# Patient Record
Sex: Female | Born: 1965 | Race: White | Hispanic: No | Marital: Married | Smoking: Never smoker
Health system: Southern US, Community
[De-identification: ages and names within clinical notes are randomized; demographics above are authoritative.]

## PROBLEM LIST (undated history)

## (undated) DIAGNOSIS — G43909 Migraine, unspecified, not intractable, without status migrainosus: Secondary | ICD-10-CM

## (undated) HISTORY — PX: WISDOM TOOTH EXTRACTION: SHX21

## (undated) HISTORY — PX: LEG SURGERY: SHX1003

---

## 1998-05-06 ENCOUNTER — Other Ambulatory Visit: Admission: RE | Admit: 1998-05-06 | Discharge: 1998-05-06 | Payer: Self-pay | Admitting: Obstetrics & Gynecology

## 1998-05-13 ENCOUNTER — Ambulatory Visit (HOSPITAL_COMMUNITY): Admission: RE | Admit: 1998-05-13 | Discharge: 1998-05-13 | Payer: Self-pay | Admitting: Obstetrics & Gynecology

## 1998-05-29 ENCOUNTER — Encounter: Payer: Self-pay | Admitting: Obstetrics & Gynecology

## 1998-05-29 ENCOUNTER — Ambulatory Visit (HOSPITAL_COMMUNITY): Admission: RE | Admit: 1998-05-29 | Discharge: 1998-05-29 | Payer: Self-pay | Admitting: Obstetrics & Gynecology

## 2001-04-13 ENCOUNTER — Other Ambulatory Visit: Admission: RE | Admit: 2001-04-13 | Discharge: 2001-04-13 | Payer: Self-pay | Admitting: Obstetrics & Gynecology

## 2002-12-27 ENCOUNTER — Other Ambulatory Visit: Admission: RE | Admit: 2002-12-27 | Discharge: 2002-12-27 | Payer: Self-pay | Admitting: Obstetrics & Gynecology

## 2004-07-07 ENCOUNTER — Other Ambulatory Visit: Admission: RE | Admit: 2004-07-07 | Discharge: 2004-07-07 | Payer: Self-pay | Admitting: Obstetrics & Gynecology

## 2007-08-07 ENCOUNTER — Inpatient Hospital Stay (HOSPITAL_COMMUNITY): Admission: AD | Admit: 2007-08-07 | Discharge: 2007-08-08 | Payer: Self-pay | Admitting: Obstetrics & Gynecology

## 2009-08-26 IMAGING — US MAMMO-RUNI-US
1 series · 9 of 9 positions shown · non-contrast
Comparison: NONE

CLINICAL DATA: JIMING.Jim RT(R)(M)(CT)   Diagnostic Mammogram.  
Right  breast asymmetry noted on screening mammogram dated 
09/20/2007.  

RIGHT BREAST MAMMOGRAM ADDITIONAL VIEWS AND RIGHT BREAST 
ULTRASOUND

[Series 1: us right breast · 0.07mm/px · 9 of 9 slices shown]
[im 1/9]
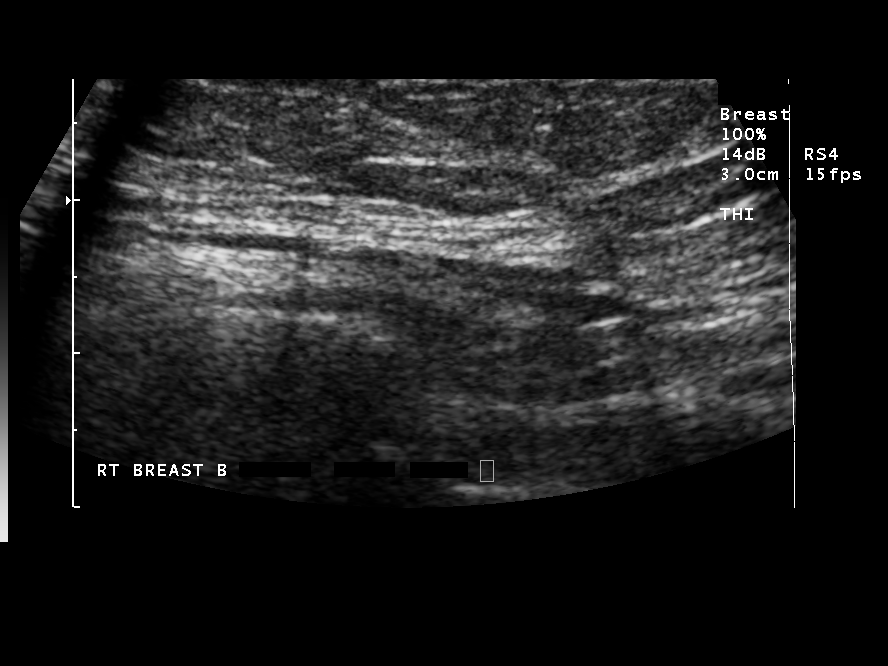
[im 2/9]
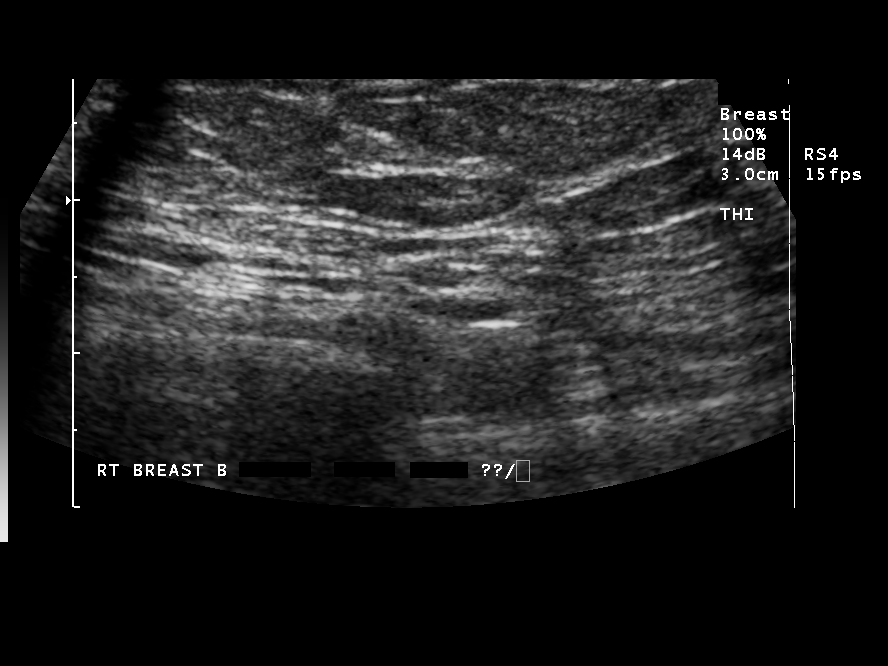
[im 3/9]
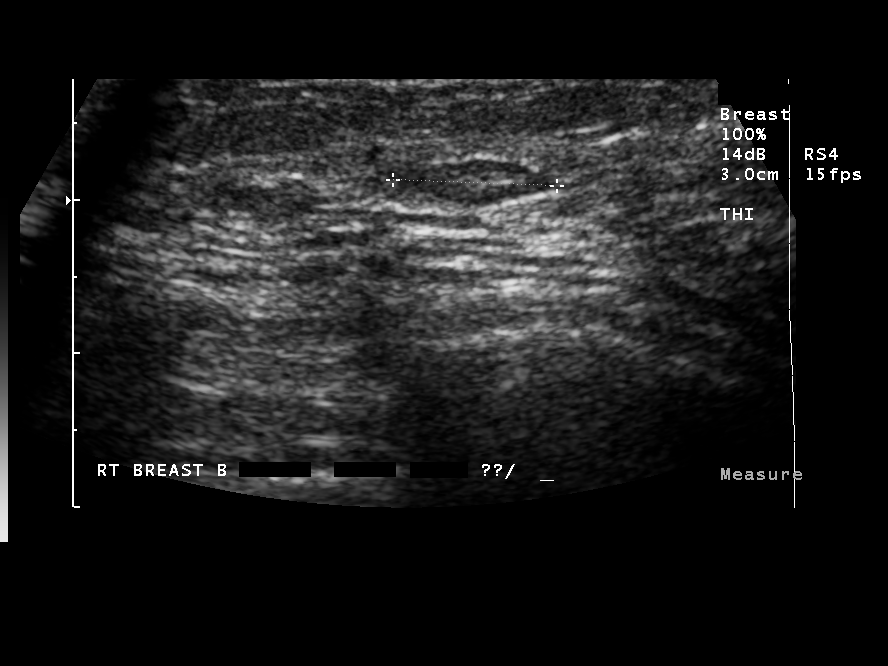
[im 4/9]
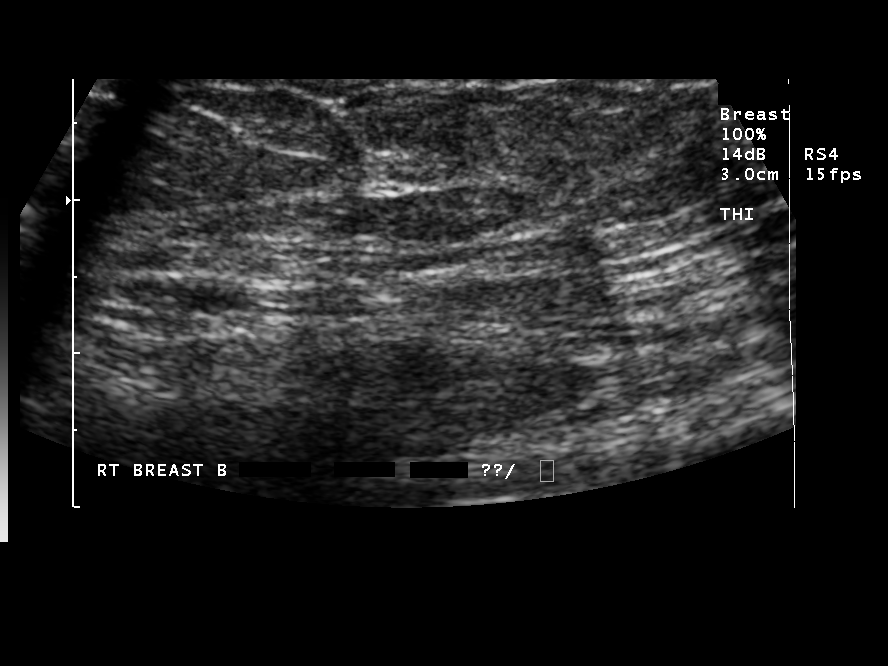
[im 5/9]
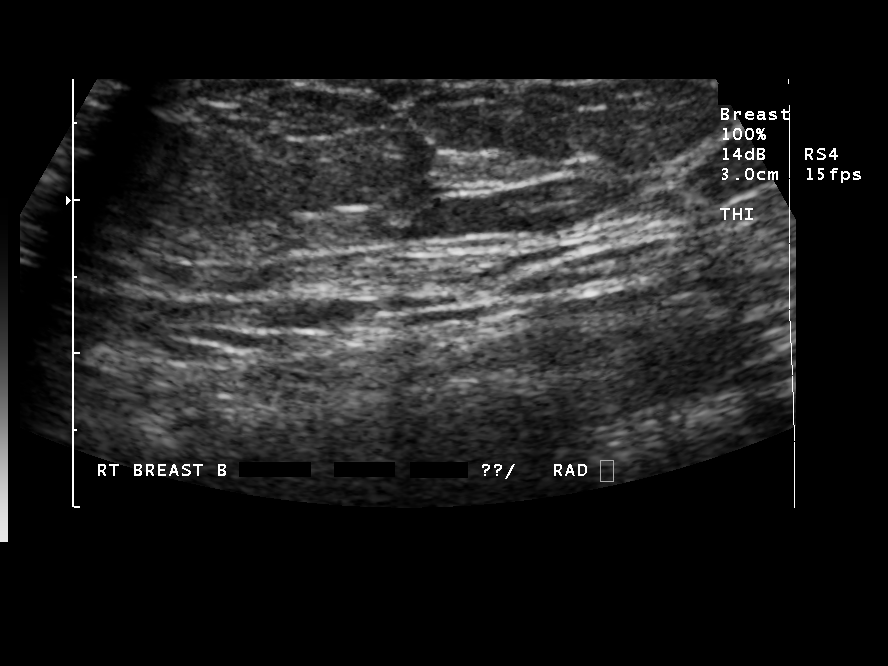
[im 6/9]
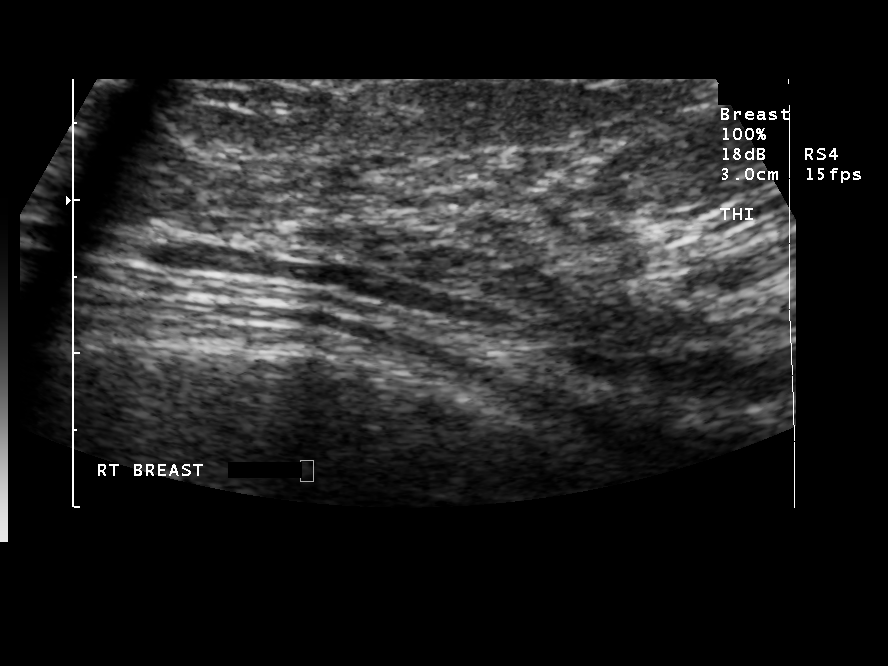
[im 7/9]
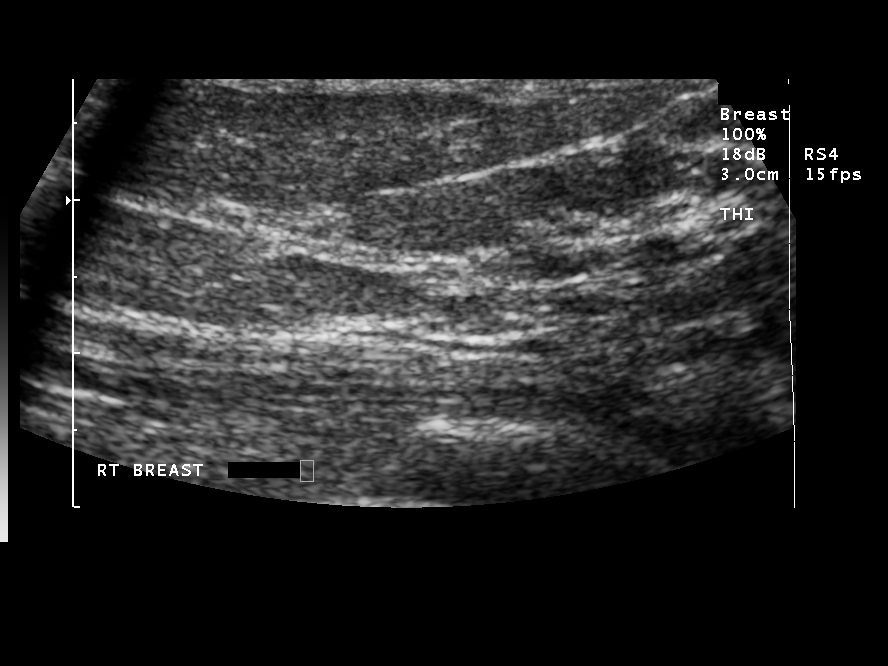
[im 8/9]
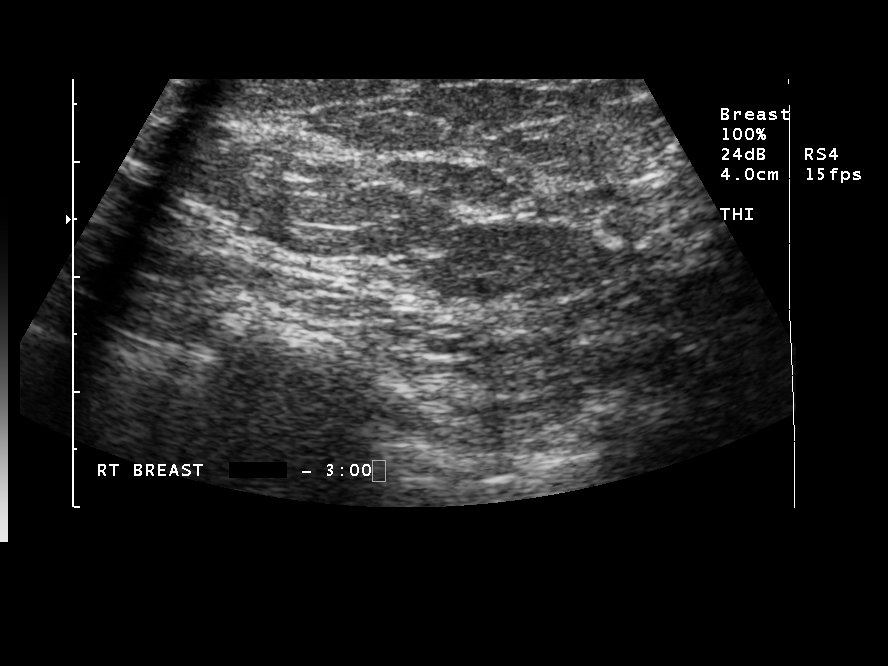
[im 9/9]
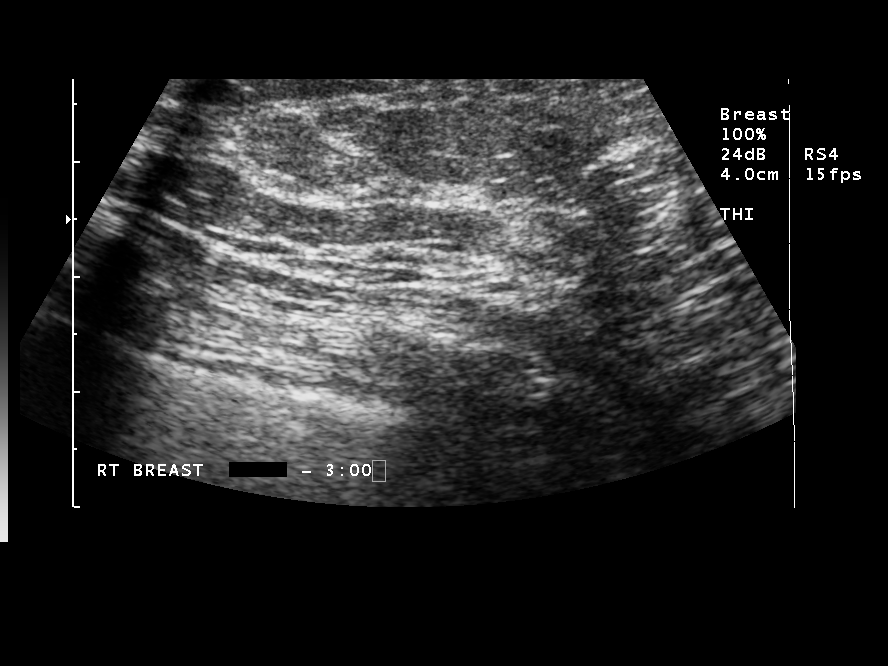

[9 of 9 positions shown; findings below may reference images not displayed]

FINDINGS: Exaggerated lateral CC view and repeat right MLO view 
demonstrates no evidence of architectural distortion, spiculation, 
mass or suspicious calcification. Ultrasound demonstrates no 
cystic, solid or shadowing masses.
IMPRESSION: No mammographic or ultrasonographic findings 
suspicious for malignancy.  Bilateral mammogram in one year is 
recommended. The patient was informed at the time of the 
examination of the findings and recommendations by verbal and 
written lay report. Computer assisted (Second Look) technology was 
used as an aid in interpretation of this study. BI-RADS 1: 
Negative. Bonmici, Reni Cynthia electronically reviewed on 
10/03/2007 Dict Date: 10/02/2007  Tran Date:  10/03/2007 BLAIN JUMPER

## 2011-03-01 LAB — CBC
HCT: 37.6
Hemoglobin: 13.3
MCHC: 35.3
MCV: 96.4
Platelets: 178
Platelets: 213
RBC: 3.51 — ABNORMAL LOW
RBC: 3.9
RDW: 13.4
WBC: 10.7 — ABNORMAL HIGH
WBC: 12.3 — ABNORMAL HIGH

## 2011-03-01 LAB — COMPREHENSIVE METABOLIC PANEL
ALT: 18
AST: 21
Albumin: 2.8 — ABNORMAL LOW
Alkaline Phosphatase: 211 — ABNORMAL HIGH
BUN: 8
CO2: 21
Calcium: 9.4
Chloride: 103
Creatinine, Ser: 0.73
GFR calc Af Amer: >60
GFR calc non Af Amer: >60
Glucose, Bld: 87
Potassium: 3.9
Sodium: 135
Total Bilirubin: 0.7
Total Protein: 6.4

## 2011-03-01 LAB — URIC ACID: Uric Acid, Serum: 6.4

## 2011-03-01 LAB — LACTATE DEHYDROGENASE: LDH: 154

## 2011-03-01 LAB — CCBB MATERNAL DONOR DRAW

## 2011-03-01 LAB — RPR: RPR Ser Ql: NONREACTIVE

## 2017-05-30 ENCOUNTER — Emergency Department (HOSPITAL_BASED_OUTPATIENT_CLINIC_OR_DEPARTMENT_OTHER): Payer: Managed Care, Other (non HMO)

## 2017-05-30 ENCOUNTER — Encounter (HOSPITAL_BASED_OUTPATIENT_CLINIC_OR_DEPARTMENT_OTHER): Payer: Self-pay | Admitting: *Deleted

## 2017-05-30 ENCOUNTER — Emergency Department (HOSPITAL_BASED_OUTPATIENT_CLINIC_OR_DEPARTMENT_OTHER)
Admission: EM | Admit: 2017-05-30 | Discharge: 2017-05-30 | Disposition: A | Discharge status: Home / Self Care | Payer: Managed Care, Other (non HMO) | Attending: Emergency Medicine | Admitting: Emergency Medicine

## 2017-05-30 ENCOUNTER — Other Ambulatory Visit: Payer: Self-pay

## 2017-05-30 DIAGNOSIS — Y929 Unspecified place or not applicable: Secondary | ICD-10-CM | POA: Insufficient documentation

## 2017-05-30 DIAGNOSIS — X500XXA Overexertion from strenuous movement or load, initial encounter: Secondary | ICD-10-CM | POA: Insufficient documentation

## 2017-05-30 DIAGNOSIS — Y939 Activity, unspecified: Secondary | ICD-10-CM | POA: Insufficient documentation

## 2017-05-30 DIAGNOSIS — Y999 Unspecified external cause status: Secondary | ICD-10-CM | POA: Diagnosis not present

## 2017-05-30 DIAGNOSIS — S8262XA Displaced fracture of lateral malleolus of left fibula, initial encounter for closed fracture: Secondary | ICD-10-CM | POA: Diagnosis not present

## 2017-05-30 DIAGNOSIS — S99912A Unspecified injury of left ankle, initial encounter: Secondary | ICD-10-CM | POA: Diagnosis present

## 2017-05-30 MED ORDER — OXYCODONE-ACETAMINOPHEN 5-325 MG PO TABS: 1.0000 | ORAL_TABLET | Freq: Four times a day (QID) | ORAL | 0 refills | Status: AC | PRN

## 2017-05-30 MED ORDER — IBUPROFEN 800 MG PO TABS
800.0000 mg | ORAL_TABLET | Freq: Once | ORAL | Status: AC
  Administered 2017-05-30: 800 mg via ORAL
  Filled 2017-05-30: qty 1

## 2017-05-30 MED ORDER — IBUPROFEN 200 MG PO TABS
ORAL_TABLET | ORAL | Status: AC
  Filled 2017-05-30: qty 4

## 2017-05-30 MED ORDER — IBUPROFEN 800 MG PO TABS: 800.0000 mg | ORAL_TABLET | Freq: Three times a day (TID) | ORAL | 0 refills | Status: AC

## 2017-05-30 MED FILL — OXYCODONE-ACETAMINOPHEN 5-3: 5-325 | 3 days supply | Qty: 10 | Fill #0

## 2017-05-30 MED FILL — IBUPROFEN 800 MG TAB: 800 | 7 days supply | Qty: 21 | Fill #0

## 2017-05-30 NOTE — ED Provider Notes (Signed)
[facility] EMERGENCY DEPARTMENT Provider Note   CSN: [id number] Arrival date & time: 05/30/17  1027     History   Chief Complaint Chief Complaint  Patient presents with  . Foot Pain    HPI Lisa Castaneda is a 51 y.o. female with no significant past medical history presenting with left ankle pain after twisting her ankle while wearing high heels last night.  Reports significant swelling and tenderness.  Pain is aggravated by movement and weightbearing. She has tried over-the-counter medications with some relief.  She has not been able to bear any weight today. she reports some tingling sensation in her toes but no loss of sensation.  No other injuries or trauma.   HPI  History reviewed. No pertinent past medical history.  There are no active problems to display for this patient.   Past Surgical History:  Procedure Laterality Date  . WISDOM TOOTH EXTRACTION      OB History    No data available       Home Medications    Prior to Admission medications   Medication Sig Start Date End Date Taking? Authorizing Provider  ibuprofen (ADVIL,MOTRIN) 800 MG tablet Take 1 tablet (800 mg total) by mouth 3 (three) times daily. 05/30/17   Georgiana ShoreMitchell, Lavon Horn B, PA-C  oxyCODONE-acetaminophen (PERCOCET/ROXICET) 5-325 MG tablet Take 1 tablet by mouth every 6 (six) hours as needed for severe pain. 05/30/17   Georgiana ShoreMitchell, Quintina Hakeem B, PA-C    Family History No family history on file.  Social History Social History   Tobacco Use  . Smoking status: Never Smoker  . Smokeless tobacco: Never Used  Substance Use Topics  . Alcohol use: Yes    Comment: social  . Drug use: No     Allergies   Patient has no known allergies.   Review of Systems Review of Systems  Musculoskeletal: Positive for arthralgias and joint swelling.  Skin: Positive for color change.  Neurological: Negative for weakness and numbness.     Physical Exam Updated Vital Signs BP 135/88 (BP  Location: Right Arm)   Pulse (!) 104   Resp 16   Ht 5\' 3"  (1.6 m)   Wt 68 kg (150 lb)   SpO2 98%   BMI 26.57 kg/m   Physical Exam  Constitutional: She appears well-developed and well-nourished. No distress.  Well-appearing, nontoxic sitting comfortably in bed no acute distress.  HENT:  Head: Normocephalic and atraumatic.  Eyes: EOM are normal.  Neck: Normal range of motion.  Cardiovascular: Normal rate, regular rhythm, normal heart sounds and intact distal pulses.  No murmur heard. Pulmonary/Chest: Effort normal and breath sounds normal. No stridor. No respiratory distress. She has no wheezes. She has no rales.  Musculoskeletal: She exhibits edema and tenderness. She exhibits no deformity.  Significant edema to the lateral malleolus on the left. Tender to palpation of the lateral malleolus. Negative anterior drawer.  Achilles intact. Neurovascularly intact. No proximal tenderness palpation of the fibula, tibia or knee.  Neurological: She is alert. No sensory deficit.  Skin: Skin is warm and dry. No rash noted. She is not diaphoretic. No erythema. No pallor.  Psychiatric: She has a normal mood and affect.  Nursing note and vitals reviewed.    ED Treatments / Results  Labs (all labs ordered are listed, but only abnormal results are displayed) Labs Reviewed - No data to display  EKG  EKG Interpretation None       Radiology Dg Ankle Complete Left  Result Date:  05/30/2017 CLINICAL DATA:  Pain and swelling following fall EXAM: LEFT ANKLE COMPLETE - 3+ VIEW COMPARISON:  None. FINDINGS: Frontal, oblique, and lateral views obtained. There is soft tissue swelling laterally. There is a comminuted fracture of the distal fibular diaphysis with fracture fragments overall in near anatomic alignment. No other fractures are evident. The ankle mortise appears intact. There is a joint effusion. No appreciable joint space narrowing or erosion. There is a small posterior calcaneal spur.  IMPRESSION: Fracture distal fibular diaphysis with fracture fragments in near anatomic alignment. Sizable joint effusion with soft tissue swelling laterally. Suspect underlying ligamentous injury. No other fracture evident. The ankle mortise appears grossly intact. Small posterior calcaneal spur noted. Electronically Signed   By: Bretta BangWilliam  Woodruff III M.D.   On: 05/30/2017 10:57    Procedures Procedures (including critical care time) SPLINT APPLICATION Date/Time: 11:45 AM Authorized by: Georgiana ShoreJessica B Salvator Seppala Consent: Verbal consent obtained. Risks and benefits: risks, benefits and alternatives were discussed Consent given by: patient Splint applied by: orthopedic technician Location details: left ankle Splint type: posterior short leg Supplies used: posterior short leg Post-procedure: The splinted body part was neurovascularly unchanged following the procedure. Patient tolerance: Patient tolerated the procedure well with no immediate complications.    Medications Ordered in ED Medications  ibuprofen (ADVIL,MOTRIN) tablet 800 mg (not administered)     Initial Impression / Assessment and Plan / ED Course  I have reviewed the triage vital signs and the nursing notes.  Pertinent labs & imaging results that were available during my care of the patient were reviewed by me and considered in my medical decision making (see chart for details).    Patient presenting with isolated nondisplaced lateral malleolus fracture distal fibula with suspected ligamentous injury.  Posterior splint applied and crutches provided.  Patient's pain was managed while in the emergency department.  Discharge home with symptomatic relief and close follow-up with orthopedics.  Discussed strict return precautions and advised to return to the emergency department if experiencing any new or worsening symptoms. Instructions were understood and patient agreed with discharge plan. Final Clinical Impressions(s) / ED  Diagnoses   Final diagnoses:  Closed fracture of distal lateral malleolus of left fibula, initial encounter    ED Discharge Orders        Ordered    ibuprofen (ADVIL,MOTRIN) 800 MG tablet  3 times daily     05/30/17 1133    oxyCODONE-acetaminophen (PERCOCET/ROXICET) 5-325 MG tablet  Every 6 hours PRN     05/30/17 1133       Gregary CromerMitchell, Cruze Zingaro B, PA-C 05/30/17 1146    Lavera GuiseLiu, Dana Duo, MD 05/30/17 1153

## 2017-05-30 NOTE — ED Triage Notes (Signed)
Pt reports she twisted her left ankle wearing heels last evening

## 2017-05-30 NOTE — ED Notes (Addendum)
D/c home with Crutches and Ice pack. Directed to pharmacy to pick up Rx. Family present to drive

## 2017-05-30 NOTE — Discharge Instructions (Signed)
As discussed, follow the rice protocol outlined in these instructions which include rest, ice, compress, elevate and follow-up with orthopedics.  Do not put weight on your foot and use your crutches for ambulation.  Take ibuprofen 800 every 8 hours and Percocet only as needed for breakthrough pain.  Return sooner if symptoms worsen, swelling, numbness, worsening pain or other new concerning symptoms in the meantime.

## 2017-05-30 NOTE — ED Notes (Signed)
ED Provider at bedside. 

## 2017-05-30 NOTE — ED Notes (Signed)
EMT at bedside applying splint 

## 2017-11-21 ENCOUNTER — Emergency Department (HOSPITAL_BASED_OUTPATIENT_CLINIC_OR_DEPARTMENT_OTHER)
Admission: EM | Admit: 2017-11-21 | Discharge: 2017-11-21 | Disposition: A | Discharge status: Home / Self Care | Payer: Managed Care, Other (non HMO) | Attending: Emergency Medicine | Admitting: Emergency Medicine

## 2017-11-21 ENCOUNTER — Other Ambulatory Visit: Payer: Self-pay

## 2017-11-21 ENCOUNTER — Encounter (HOSPITAL_BASED_OUTPATIENT_CLINIC_OR_DEPARTMENT_OTHER): Payer: Self-pay | Admitting: *Deleted

## 2017-11-21 DIAGNOSIS — Z5321 Procedure and treatment not carried out due to patient leaving prior to being seen by health care provider: Secondary | ICD-10-CM | POA: Diagnosis not present

## 2017-11-21 DIAGNOSIS — R51 Headache: Secondary | ICD-10-CM | POA: Insufficient documentation

## 2017-11-21 HISTORY — DX: Migraine, unspecified, not intractable, without status migrainosus: G43.909

## 2017-11-21 NOTE — ED Triage Notes (Signed)
This am she had a headache, tingling in her right hand and blurred vision. She has a hx of migraine headaches. All symptoms have resolved except the head pain. She took Excedrin migraine at 8am.

## 2018-03-30 ENCOUNTER — Other Ambulatory Visit: Payer: Self-pay | Admitting: Oncology

## 2018-03-30 NOTE — Progress Notes (Signed)
This patient's referral was delayed, for reasons that are not clear, and she ended up seeking treatment elsewhere.

## 2019-04-24 IMAGING — DX DG ANKLE COMPLETE 3+V*L*
3 series · 3 of 3 positions shown · non-contrast
Comparison: None.

CLINICAL DATA: Pain and swelling following fall

EXAM:
LEFT ANKLE COMPLETE - 3+ VIEW

[ankle ap]
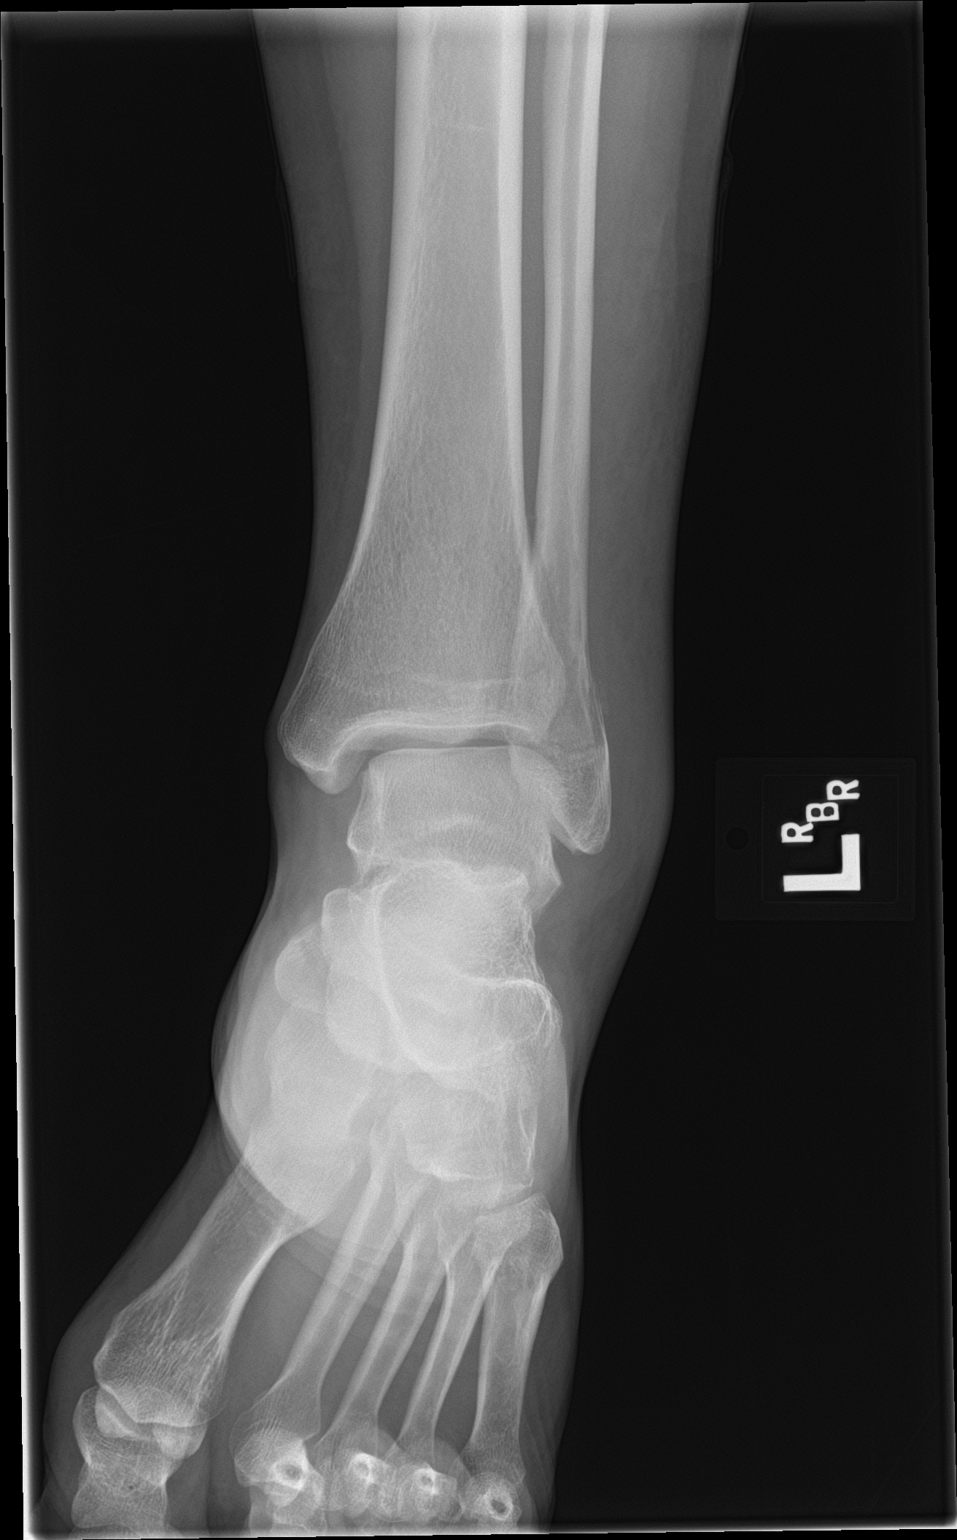

[ankle obl]
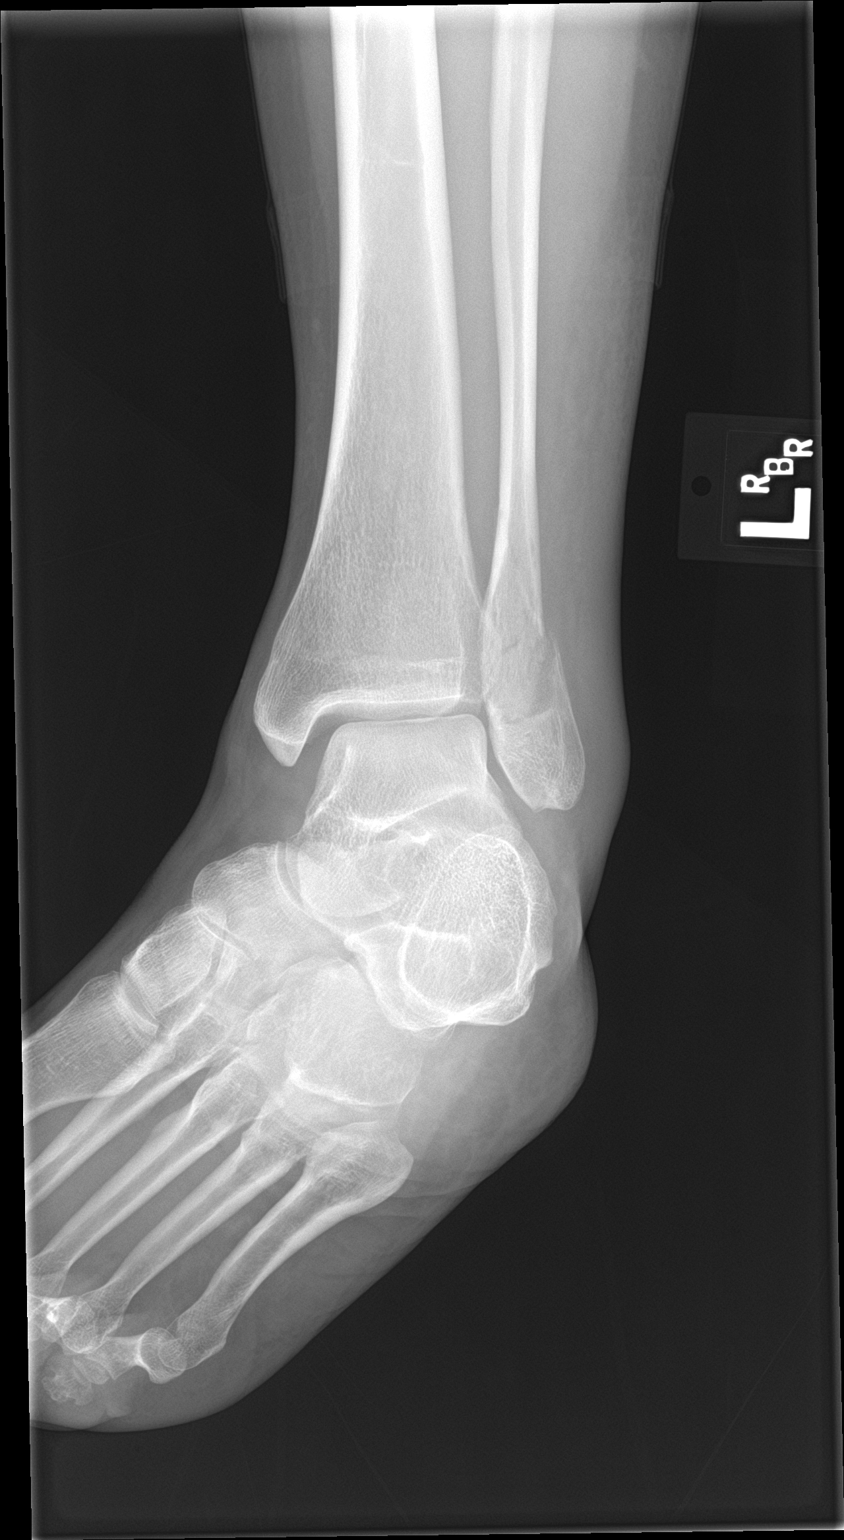

[ankle lat]
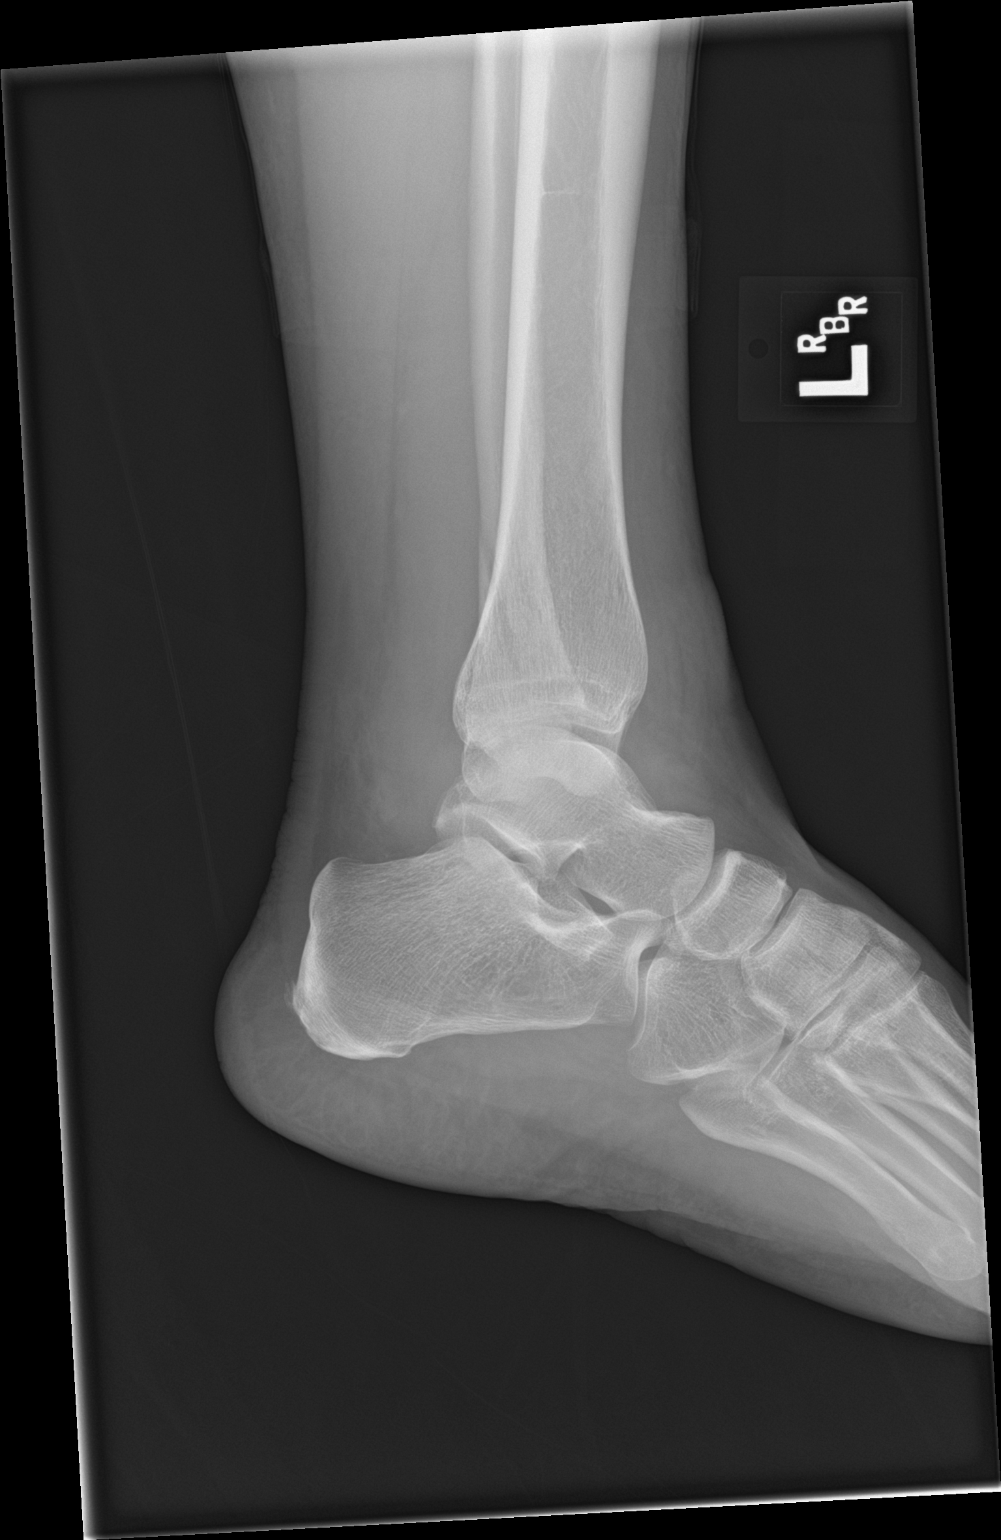

[3 of 3 positions shown; findings below may reference images not displayed]

FINDINGS: Frontal, oblique, and lateral views obtained. There is soft tissue
swelling laterally. There is a comminuted fracture of the distal
fibular diaphysis with fracture fragments overall in near anatomic
alignment. No other fractures are evident. The ankle mortise appears
intact. There is a joint effusion. No appreciable joint space
narrowing or erosion. There is a small posterior calcaneal spur.
IMPRESSION: Fracture distal fibular diaphysis with fracture fragments in near
anatomic alignment. Sizable joint effusion with soft tissue swelling
laterally. Suspect underlying ligamentous injury. No other fracture
evident. The ankle mortise appears grossly intact. Small posterior
calcaneal spur noted.

## 2022-09-07 ENCOUNTER — Emergency Department (HOSPITAL_BASED_OUTPATIENT_CLINIC_OR_DEPARTMENT_OTHER): Payer: Managed Care, Other (non HMO)

## 2022-09-07 ENCOUNTER — Encounter (HOSPITAL_BASED_OUTPATIENT_CLINIC_OR_DEPARTMENT_OTHER): Payer: Self-pay | Admitting: Emergency Medicine

## 2022-09-07 ENCOUNTER — Emergency Department (HOSPITAL_BASED_OUTPATIENT_CLINIC_OR_DEPARTMENT_OTHER)
Admission: EM | Admit: 2022-09-07 | Discharge: 2022-09-07 | Disposition: A | Discharge status: Home / Self Care | Payer: Managed Care, Other (non HMO) | Attending: Emergency Medicine | Admitting: Emergency Medicine

## 2022-09-07 ENCOUNTER — Other Ambulatory Visit: Payer: Self-pay

## 2022-09-07 DIAGNOSIS — R110 Nausea: Secondary | ICD-10-CM | POA: Insufficient documentation

## 2022-09-07 DIAGNOSIS — R109 Unspecified abdominal pain: Secondary | ICD-10-CM | POA: Insufficient documentation

## 2022-09-07 DIAGNOSIS — R21 Rash and other nonspecific skin eruption: Secondary | ICD-10-CM | POA: Diagnosis not present

## 2022-09-07 LAB — CBC WITH DIFFERENTIAL/PLATELET
Abs Immature Granulocytes: 0.03 10*3/uL (ref 0.00–0.07)
Basophils Absolute: 0 10*3/uL (ref 0.0–0.1)
Basophils Relative: 1 %
Eosinophils Absolute: 0.3 10*3/uL (ref 0.0–0.5)
Eosinophils Relative: 3 %
HCT: 42.9 % (ref 36.0–46.0)
Hemoglobin: 14.6 g/dL (ref 12.0–15.0)
Immature Granulocytes: 0 %
Lymphocytes Relative: 33 %
Lymphs Abs: 2.8 10*3/uL (ref 0.7–4.0)
MCH: 31.9 pg (ref 26.0–34.0)
MCHC: 34 g/dL (ref 30.0–36.0)
MCV: 93.9 fL (ref 80.0–100.0)
Monocytes Absolute: 0.6 10*3/uL (ref 0.1–1.0)
Monocytes Relative: 8 %
Neutro Abs: 4.6 10*3/uL (ref 1.7–7.7)
Neutrophils Relative %: 55 %
Platelets: 325 10*3/uL (ref 150–400)
RBC: 4.57 MIL/uL (ref 3.87–5.11)
RDW: 12.8 % (ref 11.5–15.5)
WBC: 8.3 10*3/uL (ref 4.0–10.5)
nRBC: 0 % (ref 0.0–0.2)

## 2022-09-07 LAB — COMPREHENSIVE METABOLIC PANEL
ALT: 23 U/L (ref 0–44)
AST: 17 U/L (ref 15–41)
Albumin: 4.5 g/dL (ref 3.5–5.0)
Alkaline Phosphatase: 66 U/L (ref 38–126)
Anion gap: 8 (ref 5–15)
BUN: 14 mg/dL (ref 6–20)
CO2: 28 mmol/L (ref 22–32)
Calcium: 8.9 mg/dL (ref 8.9–10.3)
Chloride: 100 mmol/L (ref 98–111)
Creatinine, Ser: 0.84 mg/dL (ref 0.44–1.00)
GFR, Estimated: >60 mL/min (ref >60)
Glucose, Bld: 115 mg/dL — ABNORMAL HIGH (ref 70–99)
Potassium: 3.9 mmol/L (ref 3.5–5.1)
Sodium: 136 mmol/L (ref 135–145)
Total Bilirubin: 0.7 mg/dL (ref 0.3–1.2)
Total Protein: 7.5 g/dL (ref 6.5–8.1)

## 2022-09-07 LAB — URINALYSIS, ROUTINE W REFLEX MICROSCOPIC
Bilirubin Urine: NEGATIVE
Glucose, UA: NEGATIVE mg/dL
Hgb urine dipstick: NEGATIVE
Ketones, ur: NEGATIVE mg/dL
Nitrite: NEGATIVE
Protein, ur: NEGATIVE mg/dL
Specific Gravity, Urine: 1.02 (ref 1.005–1.030)
pH: 7 (ref 5.0–8.0)

## 2022-09-07 LAB — URINALYSIS, MICROSCOPIC (REFLEX)

## 2022-09-07 LAB — LIPASE, BLOOD: Lipase: 46 U/L (ref 11–51)

## 2022-09-07 MED ORDER — METHOCARBAMOL 500 MG PO TABS: 500.0000 mg | ORAL_TABLET | Freq: Two times a day (BID) | ORAL | 0 refills | Status: AC

## 2022-09-07 MED ORDER — NAPROXEN 500 MG PO TABS: 500.0000 mg | ORAL_TABLET | Freq: Two times a day (BID) | ORAL | 0 refills | Status: AC

## 2022-09-07 MED ORDER — FENTANYL CITRATE PF 50 MCG/ML IJ SOSY
50.0000 ug | PREFILLED_SYRINGE | Freq: Once | INTRAMUSCULAR | Status: AC
  Administered 2022-09-07: 50 ug via INTRAVENOUS
  Filled 2022-09-07: qty 1

## 2022-09-07 MED ORDER — SODIUM CHLORIDE 0.9 % IV BOLUS
1000.0000 mL | Freq: Once | INTRAVENOUS | Status: AC
  Administered 2022-09-07: 1000 mL via INTRAVENOUS

## 2022-09-07 MED ORDER — ONDANSETRON HCL 4 MG/2ML IJ SOLN
4.0000 mg | Freq: Once | INTRAMUSCULAR | Status: AC
  Administered 2022-09-07: 4 mg via INTRAVENOUS
  Filled 2022-09-07: qty 2

## 2022-09-07 MED ORDER — KETOROLAC TROMETHAMINE 30 MG/ML IJ SOLN
30.0000 mg | Freq: Once | INTRAMUSCULAR | Status: AC
  Administered 2022-09-07: 30 mg via INTRAVENOUS
  Filled 2022-09-07: qty 1

## 2022-09-07 NOTE — ED Triage Notes (Signed)
Abd pain started in back and moved to front with nausea, X 24 hours, states had a rash about 1 week ago and was given steroids pain started a 2 days ago. Seen by PCP today the did a urine but other labs not done due to their lab being closed.

## 2022-09-07 NOTE — ED Provider Notes (Signed)
[facility] [place]  Provider Note  CSN: [id number] Arrival date & time: 09/07/22 0309  History Chief Complaint  Patient presents with   Abdominal Pain    Lisa Castaneda is a 57 y.o. female presents for 1 day of L flank pain, now moved to LLQ abdomen. Waxes and wanes, worse with lying down. Associated with nausea and increased urinary frequency. No fever. No prior history of similar. Went to PCP yesterday for the flank pain and had a POC UA done which was reportedly normal. She also reports she had an itchy rash on her back a week ago, given prednisone pack with minimal improvement.    Home Medications Prior to Admission medications   Medication Sig Start Date End Date Taking? Authorizing Provider  ibuprofen (ADVIL,MOTRIN) 800 MG tablet Take 1 tablet (800 mg total) by mouth 3 (three) times daily. 05/30/17   Emeline General, PA-C  methocarbamol (ROBAXIN) 500 MG tablet Take 1 tablet (500 mg total) by mouth 2 (two) times daily. 09/07/22  Yes Truddie Hidden, MD  naproxen (NAPROSYN) 500 MG tablet Take 1 tablet (500 mg total) by mouth 2 (two) times daily. 09/07/22  Yes Truddie Hidden, MD  oxyCODONE-acetaminophen (PERCOCET/ROXICET) 5-325 MG tablet Take 1 tablet by mouth every 6 (six) hours as needed for severe pain. 05/30/17   Emeline General, PA-C     Allergies    Patient has no known allergies.   Review of Systems   Review of Systems Please see HPI for pertinent positives and negatives  Physical Exam BP (!) 146/104 (BP Location: Left Arm)   Pulse 73   Temp 98 F (36.7 C) (Oral)   Resp 18   Ht 5\' 3"  (1.6 m)   Wt 67.1 kg   SpO2 98%   BMI 26.22 kg/m   Physical Exam Vitals and nursing note reviewed.  Constitutional:      Appearance: Normal appearance.  HENT:     Head: Normocephalic and atraumatic.     Nose: Nose normal.     Mouth/Throat:     Mouth: Mucous membranes are moist.  Eyes:     Extraocular Movements:  Extraocular movements intact.     Conjunctiva/sclera: Conjunctivae normal.  Cardiovascular:     Rate and Rhythm: Normal rate.  Pulmonary:     Effort: Pulmonary effort is normal.     Breath sounds: Normal breath sounds.  Abdominal:     General: Abdomen is flat.     Palpations: Abdomen is soft.     Tenderness: There is no abdominal tenderness. There is no guarding. Negative signs include Murphy's sign and McBurney's sign.  Musculoskeletal:        General: No swelling. Normal range of motion.     Cervical back: Neck supple.  Skin:    General: Skin is warm and dry.     Findings: Rash (? pityriasis rash on back) present.  Neurological:     General: No focal deficit present.     Mental Status: She is alert.  Psychiatric:        Mood and Affect: Mood normal.     ED Results / Procedures / Treatments   EKG None  Procedures Procedures  Medications Ordered in the ED Medications  fentaNYL (SUBLIMAZE) injection 50 mcg (50 mcg Intravenous Given 09/07/22 0402)  ondansetron (ZOFRAN) injection 4 mg (4 mg Intravenous Given 09/07/22 0401)  sodium chloride 0.9 % bolus 1,000 mL ( Intravenous Stopped 09/07/22 0517)  ketorolac (  TORADOL) 30 MG/ML injection 30 mg (30 mg Intravenous Given 09/07/22 0510)    Initial Impression and Plan  Patient here with flank pain, now radiating to LLQ. Suspect renal colic, less likely diverticulitis or MSK pain. Will check labs, send for CT and give pain/nausea meds for comfort.   ED Course   Clinical Course as of 09/07/22 0535  Tue Sep 07, 2022  0355 UA is normal.  [CS]  0404 CBC is normal.  [CS]  0423 CMP and lipase are normal.  [CS]  0429 I personally viewed the images from radiology studies and agree with radiologist interpretation: CT shows an intrarenal stone, but no ureteral stone. No other acute finding.  [CS]  0502 Patient still having flank pain. No definite cause identified with ED workup but does not appear to be an emergent or surgical process. Will  give Toradol and reassess for symptom control.  [CS]  [id number] Patient reports some improvement after Toradol. Plan discharge with Rx for Naprosyn. Add muscle relaxer as this may be MSK pain. Recommend PCP follow up, RTED if symptoms worsen.  [CS]    Clinical Course User Index [CS] Truddie Hidden, MD     MDM Rules/Calculators/A&P Medical Decision Making Problems Addressed: Flank pain: acute illness or injury  Amount and/or Complexity of Data Reviewed Labs: ordered. Decision-making details documented in ED Course. Radiology: ordered and independent interpretation performed. Decision-making details documented in ED Course.  Risk Prescription drug management. Parenteral controlled substances.     Final Clinical Impression(s) / ED Diagnoses Final diagnoses:  Flank pain    Rx / DC Orders ED Discharge Orders          Ordered    naproxen (NAPROSYN) 500 MG tablet  2 times daily        09/07/22 0535    methocarbamol (ROBAXIN) 500 MG tablet  2 times daily        09/07/22 0535             Truddie Hidden, MD 09/07/22 [phone]
